# Patient Record
Sex: Female | Born: 1975 | Race: White | Hispanic: No | Marital: Married | Smoking: Never smoker
Health system: Southern US, Community
[De-identification: ages and names within clinical notes are randomized; demographics above are authoritative.]

## PROBLEM LIST (undated history)

## (undated) DIAGNOSIS — Q211 Atrial septal defect: Secondary | ICD-10-CM

## (undated) DIAGNOSIS — Q2112 Patent foramen ovale: Secondary | ICD-10-CM

## (undated) DIAGNOSIS — F419 Anxiety disorder, unspecified: Secondary | ICD-10-CM

---

## 1999-12-15 ENCOUNTER — Other Ambulatory Visit: Admission: RE | Admit: 1999-12-15 | Discharge: 1999-12-15 | Payer: Self-pay | Admitting: Family Medicine

## 2000-01-29 ENCOUNTER — Encounter: Payer: Self-pay | Admitting: Family Medicine

## 2000-01-29 ENCOUNTER — Ambulatory Visit (HOSPITAL_COMMUNITY): Admission: RE | Admit: 2000-01-29 | Discharge: 2000-01-29 | Payer: Self-pay | Admitting: Family Medicine

## 2000-04-22 ENCOUNTER — Ambulatory Visit (HOSPITAL_COMMUNITY): Admission: RE | Admit: 2000-04-22 | Discharge: 2000-04-22 | Payer: Self-pay | Admitting: Family Medicine

## 2000-04-22 ENCOUNTER — Encounter: Payer: Self-pay | Admitting: Family Medicine

## 2000-06-17 ENCOUNTER — Inpatient Hospital Stay (HOSPITAL_COMMUNITY): Admission: AD | Admit: 2000-06-17 | Discharge: 2000-06-20 | Payer: Self-pay | Admitting: Family Medicine

## 2000-06-17 ENCOUNTER — Encounter: Payer: Self-pay | Admitting: Family Medicine

## 2000-12-28 ENCOUNTER — Other Ambulatory Visit: Admission: RE | Admit: 2000-12-28 | Discharge: 2000-12-28 | Payer: Self-pay | Admitting: Family Medicine

## 2002-01-08 ENCOUNTER — Other Ambulatory Visit: Admission: RE | Admit: 2002-01-08 | Discharge: 2002-01-08 | Payer: Self-pay | Admitting: Family Medicine

## 2002-07-02 ENCOUNTER — Other Ambulatory Visit: Admission: RE | Admit: 2002-07-02 | Discharge: 2002-07-02 | Payer: Self-pay | Admitting: Family Medicine

## 2003-08-01 ENCOUNTER — Other Ambulatory Visit: Admission: RE | Admit: 2003-08-01 | Discharge: 2003-08-01 | Payer: Self-pay | Admitting: Family Medicine

## 2004-05-16 ENCOUNTER — Ambulatory Visit (HOSPITAL_COMMUNITY): Admission: RE | Admit: 2004-05-16 | Discharge: 2004-05-16 | Payer: Self-pay | Admitting: Family Medicine

## 2004-09-10 ENCOUNTER — Other Ambulatory Visit: Admission: RE | Admit: 2004-09-10 | Discharge: 2004-09-10 | Payer: Self-pay | Admitting: Family Medicine

## 2005-09-24 ENCOUNTER — Other Ambulatory Visit: Admission: RE | Admit: 2005-09-24 | Discharge: 2005-09-24 | Payer: Self-pay | Admitting: Family Medicine

## 2012-03-24 ENCOUNTER — Other Ambulatory Visit: Payer: Self-pay | Admitting: Family

## 2012-03-24 DIAGNOSIS — Z124 Encounter for screening for malignant neoplasm of cervix: Secondary | ICD-10-CM | POA: Insufficient documentation

## 2012-03-27 ENCOUNTER — Other Ambulatory Visit (HOSPITAL_COMMUNITY)
Admission: RE | Admit: 2012-03-27 | Discharge: 2012-03-27 | Disposition: A | Discharge status: Home / Self Care | Payer: BC Managed Care – PPO | Source: Ambulatory Visit | Attending: Family Medicine | Admitting: Family Medicine

## 2015-11-03 DIAGNOSIS — F418 Other specified anxiety disorders: Secondary | ICD-10-CM | POA: Diagnosis not present

## 2015-11-12 DIAGNOSIS — E663 Overweight: Secondary | ICD-10-CM | POA: Diagnosis not present

## 2015-11-26 DIAGNOSIS — G43909 Migraine, unspecified, not intractable, without status migrainosus: Secondary | ICD-10-CM | POA: Diagnosis not present

## 2015-11-26 DIAGNOSIS — Z713 Dietary counseling and surveillance: Secondary | ICD-10-CM | POA: Diagnosis not present

## 2016-04-28 DIAGNOSIS — K649 Unspecified hemorrhoids: Secondary | ICD-10-CM | POA: Diagnosis not present

## 2016-04-28 DIAGNOSIS — Z23 Encounter for immunization: Secondary | ICD-10-CM | POA: Diagnosis not present

## 2016-04-28 DIAGNOSIS — M79671 Pain in right foot: Secondary | ICD-10-CM | POA: Diagnosis not present

## 2016-05-12 DIAGNOSIS — D229 Melanocytic nevi, unspecified: Secondary | ICD-10-CM | POA: Diagnosis not present

## 2016-05-12 DIAGNOSIS — L814 Other melanin hyperpigmentation: Secondary | ICD-10-CM | POA: Diagnosis not present

## 2016-05-12 DIAGNOSIS — D1801 Hemangioma of skin and subcutaneous tissue: Secondary | ICD-10-CM | POA: Diagnosis not present

## 2016-05-26 DIAGNOSIS — Z0000 Encounter for general adult medical examination without abnormal findings: Secondary | ICD-10-CM | POA: Diagnosis not present

## 2016-05-26 DIAGNOSIS — Z111 Encounter for screening for respiratory tuberculosis: Secondary | ICD-10-CM | POA: Diagnosis not present

## 2016-05-26 DIAGNOSIS — Z13220 Encounter for screening for lipoid disorders: Secondary | ICD-10-CM | POA: Diagnosis not present

## 2016-05-26 DIAGNOSIS — Z131 Encounter for screening for diabetes mellitus: Secondary | ICD-10-CM | POA: Diagnosis not present

## 2016-05-26 DIAGNOSIS — F418 Other specified anxiety disorders: Secondary | ICD-10-CM | POA: Diagnosis not present

## 2016-08-11 DIAGNOSIS — D485 Neoplasm of uncertain behavior of skin: Secondary | ICD-10-CM | POA: Diagnosis not present

## 2016-08-11 DIAGNOSIS — D2261 Melanocytic nevi of right upper limb, including shoulder: Secondary | ICD-10-CM | POA: Diagnosis not present

## 2016-10-04 DIAGNOSIS — K644 Residual hemorrhoidal skin tags: Secondary | ICD-10-CM | POA: Diagnosis not present

## 2016-10-04 DIAGNOSIS — K641 Second degree hemorrhoids: Secondary | ICD-10-CM | POA: Diagnosis not present

## 2016-11-15 DIAGNOSIS — F418 Other specified anxiety disorders: Secondary | ICD-10-CM | POA: Diagnosis not present

## 2016-11-24 DIAGNOSIS — I788 Other diseases of capillaries: Secondary | ICD-10-CM | POA: Diagnosis not present

## 2017-01-19 DIAGNOSIS — F419 Anxiety disorder, unspecified: Secondary | ICD-10-CM | POA: Diagnosis not present

## 2017-01-19 DIAGNOSIS — F418 Other specified anxiety disorders: Secondary | ICD-10-CM | POA: Diagnosis not present

## 2017-03-30 DIAGNOSIS — F418 Other specified anxiety disorders: Secondary | ICD-10-CM | POA: Diagnosis not present

## 2017-03-30 DIAGNOSIS — Z23 Encounter for immunization: Secondary | ICD-10-CM | POA: Diagnosis not present

## 2017-10-13 DIAGNOSIS — F418 Other specified anxiety disorders: Secondary | ICD-10-CM | POA: Diagnosis not present

## 2017-10-13 DIAGNOSIS — Z3041 Encounter for surveillance of contraceptive pills: Secondary | ICD-10-CM | POA: Diagnosis not present

## 2017-10-13 DIAGNOSIS — F419 Anxiety disorder, unspecified: Secondary | ICD-10-CM | POA: Diagnosis not present

## 2017-12-27 DIAGNOSIS — M5442 Lumbago with sciatica, left side: Secondary | ICD-10-CM | POA: Diagnosis not present

## 2018-04-12 DIAGNOSIS — F418 Other specified anxiety disorders: Secondary | ICD-10-CM | POA: Diagnosis not present

## 2018-04-12 DIAGNOSIS — R350 Frequency of micturition: Secondary | ICD-10-CM | POA: Diagnosis not present

## 2018-05-22 DIAGNOSIS — Z23 Encounter for immunization: Secondary | ICD-10-CM | POA: Diagnosis not present

## 2018-05-22 DIAGNOSIS — R300 Dysuria: Secondary | ICD-10-CM | POA: Diagnosis not present

## 2018-07-07 DIAGNOSIS — L03213 Periorbital cellulitis: Secondary | ICD-10-CM | POA: Diagnosis not present

## 2018-10-12 DIAGNOSIS — G479 Sleep disorder, unspecified: Secondary | ICD-10-CM | POA: Diagnosis not present

## 2018-10-12 DIAGNOSIS — F39 Unspecified mood [affective] disorder: Secondary | ICD-10-CM | POA: Diagnosis not present

## 2018-10-12 DIAGNOSIS — F419 Anxiety disorder, unspecified: Secondary | ICD-10-CM | POA: Diagnosis not present

## 2019-05-02 DIAGNOSIS — Z124 Encounter for screening for malignant neoplasm of cervix: Secondary | ICD-10-CM | POA: Diagnosis not present

## 2019-05-02 DIAGNOSIS — Z0000 Encounter for general adult medical examination without abnormal findings: Secondary | ICD-10-CM | POA: Diagnosis not present

## 2019-05-02 DIAGNOSIS — Z13220 Encounter for screening for lipoid disorders: Secondary | ICD-10-CM | POA: Diagnosis not present

## 2019-05-02 DIAGNOSIS — Z23 Encounter for immunization: Secondary | ICD-10-CM | POA: Diagnosis not present

## 2019-05-07 DIAGNOSIS — G5601 Carpal tunnel syndrome, right upper limb: Secondary | ICD-10-CM | POA: Diagnosis not present

## 2019-10-01 ENCOUNTER — Encounter (HOSPITAL_BASED_OUTPATIENT_CLINIC_OR_DEPARTMENT_OTHER): Payer: Self-pay | Admitting: Orthopaedic Surgery

## 2019-10-01 ENCOUNTER — Other Ambulatory Visit: Payer: Self-pay

## 2019-10-04 ENCOUNTER — Other Ambulatory Visit (HOSPITAL_COMMUNITY)
Admission: RE | Admit: 2019-10-04 | Discharge: 2019-10-04 | Disposition: A | Discharge status: Home / Self Care | Payer: 59 | Source: Ambulatory Visit | Attending: Orthopaedic Surgery | Admitting: Orthopaedic Surgery

## 2019-10-04 DIAGNOSIS — Z20822 Contact with and (suspected) exposure to covid-19: Secondary | ICD-10-CM | POA: Diagnosis not present

## 2019-10-04 DIAGNOSIS — Z01812 Encounter for preprocedural laboratory examination: Secondary | ICD-10-CM | POA: Insufficient documentation

## 2019-10-04 LAB — SARS CORONAVIRUS 2 (TAT 6-24 HRS): SARS Coronavirus 2: NEGATIVE

## 2019-10-04 NOTE — Progress Notes (Signed)

## 2019-10-08 ENCOUNTER — Ambulatory Visit (HOSPITAL_BASED_OUTPATIENT_CLINIC_OR_DEPARTMENT_OTHER): Payer: Worker's Compensation | Admitting: Anesthesiology

## 2019-10-08 ENCOUNTER — Encounter (HOSPITAL_BASED_OUTPATIENT_CLINIC_OR_DEPARTMENT_OTHER)
Admission: RE | Disposition: A | Discharge status: Home / Self Care | Payer: Self-pay | Source: Home / Self Care | Attending: Orthopaedic Surgery

## 2019-10-08 ENCOUNTER — Encounter (HOSPITAL_BASED_OUTPATIENT_CLINIC_OR_DEPARTMENT_OTHER): Payer: Self-pay | Admitting: Orthopaedic Surgery

## 2019-10-08 ENCOUNTER — Ambulatory Visit (HOSPITAL_BASED_OUTPATIENT_CLINIC_OR_DEPARTMENT_OTHER)
Admission: RE | Admit: 2019-10-08 | Discharge: 2019-10-08 | Disposition: A | Discharge status: Home / Self Care | Payer: Worker's Compensation | Attending: Orthopaedic Surgery | Admitting: Orthopaedic Surgery

## 2019-10-08 DIAGNOSIS — F419 Anxiety disorder, unspecified: Secondary | ICD-10-CM | POA: Insufficient documentation

## 2019-10-08 DIAGNOSIS — Z79899 Other long term (current) drug therapy: Secondary | ICD-10-CM | POA: Diagnosis not present

## 2019-10-08 DIAGNOSIS — Q211 Atrial septal defect: Secondary | ICD-10-CM | POA: Insufficient documentation

## 2019-10-08 DIAGNOSIS — G5621 Lesion of ulnar nerve, right upper limb: Secondary | ICD-10-CM | POA: Diagnosis not present

## 2019-10-08 DIAGNOSIS — G5601 Carpal tunnel syndrome, right upper limb: Secondary | ICD-10-CM | POA: Diagnosis present

## 2019-10-08 HISTORY — PX: CARPAL TUNNEL RELEASE: SHX101

## 2019-10-08 HISTORY — DX: Atrial septal defect: Q21.1

## 2019-10-08 HISTORY — DX: Patent foramen ovale: Q21.12

## 2019-10-08 HISTORY — DX: Anxiety disorder, unspecified: F41.9

## 2019-10-08 LAB — POCT PREGNANCY, URINE: Preg Test, Ur: NEGATIVE

## 2019-10-08 SURGERY — CARPAL TUNNEL RELEASE
Anesthesia: Monitor Anesthesia Care | Site: Arm Lower | Laterality: Right

## 2019-10-08 MED ORDER — DEXAMETHASONE SODIUM PHOSPHATE 10 MG/ML IJ SOLN
INTRAMUSCULAR | Status: DC | PRN
  Administered 2019-10-08: 10 mg

## 2019-10-08 MED ORDER — ROPIVACAINE HCL 5 MG/ML IJ SOLN
INTRAMUSCULAR | Status: DC | PRN
  Administered 2019-10-08: 30 mL via PERINEURAL

## 2019-10-08 MED ORDER — BUPIVACAINE HCL (PF) 0.5 % IJ SOLN
INTRAMUSCULAR | Status: AC
  Filled 2019-10-08: qty 30

## 2019-10-08 MED ORDER — CEFAZOLIN SODIUM-DEXTROSE 2-4 GM/100ML-% IV SOLN
2.0000 g | INTRAVENOUS | Status: AC
  Administered 2019-10-08: 2 g via INTRAVENOUS

## 2019-10-08 MED ORDER — SCOPOLAMINE 1 MG/3DAYS TD PT72
MEDICATED_PATCH | TRANSDERMAL | Status: AC
  Filled 2019-10-08: qty 1

## 2019-10-08 MED ORDER — CHLORHEXIDINE GLUCONATE 4 % EX LIQD: 60.0000 mL | Freq: Once | CUTANEOUS | Status: DC

## 2019-10-08 MED ORDER — 0.9 % SODIUM CHLORIDE (POUR BTL) OPTIME
TOPICAL | Status: DC | PRN
  Administered 2019-10-08: 11:00:00 1000 mL

## 2019-10-08 MED ORDER — SCOPOLAMINE 1 MG/3DAYS TD PT72
1.0000 | MEDICATED_PATCH | TRANSDERMAL | Status: DC
  Administered 2019-10-08: 1.5 mg via TRANSDERMAL

## 2019-10-08 MED ORDER — HYDROCODONE-ACETAMINOPHEN 5-325 MG PO TABS: 1.0000 | ORAL_TABLET | Freq: Four times a day (QID) | ORAL | 0 refills | Status: AC | PRN

## 2019-10-08 MED ORDER — PROPOFOL 10 MG/ML IV BOLUS
INTRAVENOUS | Status: DC | PRN
  Administered 2019-10-08: 20 mg via INTRAVENOUS

## 2019-10-08 MED ORDER — FENTANYL CITRATE (PF) 100 MCG/2ML IJ SOLN
50.0000 ug | INTRAMUSCULAR | Status: DC | PRN
  Administered 2019-10-08: 50 ug via INTRAVENOUS

## 2019-10-08 MED ORDER — LIDOCAINE HCL (PF) 1 % IJ SOLN
INTRAMUSCULAR | Status: AC
  Filled 2019-10-08: qty 30

## 2019-10-08 MED ORDER — ONDANSETRON HCL 4 MG/2ML IJ SOLN
INTRAMUSCULAR | Status: DC | PRN
  Administered 2019-10-08: 4 mg via INTRAVENOUS

## 2019-10-08 MED ORDER — MIDAZOLAM HCL 2 MG/2ML IJ SOLN
1.0000 mg | INTRAMUSCULAR | Status: DC | PRN
  Administered 2019-10-08: 2 mg via INTRAVENOUS

## 2019-10-08 MED ORDER — ACETAMINOPHEN 500 MG PO TABS
1000.0000 mg | ORAL_TABLET | Freq: Once | ORAL | Status: AC
  Administered 2019-10-08: 1000 mg via ORAL

## 2019-10-08 MED ORDER — PROPOFOL 500 MG/50ML IV EMUL
INTRAVENOUS | Status: DC | PRN
  Administered 2019-10-08: 200 ug/kg/min via INTRAVENOUS

## 2019-10-08 MED ORDER — FENTANYL CITRATE (PF) 100 MCG/2ML IJ SOLN
INTRAMUSCULAR | Status: AC
  Filled 2019-10-08: qty 2

## 2019-10-08 MED ORDER — LACTATED RINGERS IV SOLN: INTRAVENOUS | Status: DC

## 2019-10-08 MED ORDER — ACETAMINOPHEN 500 MG PO TABS
ORAL_TABLET | ORAL | Status: AC
  Filled 2019-10-08: qty 2

## 2019-10-08 MED ORDER — CEFAZOLIN SODIUM-DEXTROSE 2-4 GM/100ML-% IV SOLN
INTRAVENOUS | Status: AC
  Filled 2019-10-08: qty 100

## 2019-10-08 MED ORDER — MIDAZOLAM HCL 2 MG/2ML IJ SOLN
INTRAMUSCULAR | Status: AC
  Filled 2019-10-08: qty 2

## 2019-10-08 SURGICAL SUPPLY — 50 items
APL PRP STRL LF DISP 70% ISPRP (MISCELLANEOUS) ×1
BLADE SURG 15 STRL LF DISP TIS (BLADE) ×2 IMPLANT
BLADE SURG 15 STRL SS (BLADE) ×4
BNDG CMPR 9X4 STRL LF SNTH (GAUZE/BANDAGES/DRESSINGS) ×1
BNDG COHESIVE 4X5 TAN STRL (GAUZE/BANDAGES/DRESSINGS) ×1 IMPLANT
BNDG ESMARK 4X9 LF (GAUZE/BANDAGES/DRESSINGS) ×2 IMPLANT
BNDG GAUZE ELAST 4 BULKY (GAUZE/BANDAGES/DRESSINGS) ×2 IMPLANT
CHLORAPREP W/TINT 26 (MISCELLANEOUS) ×2 IMPLANT
CORD BIPOLAR FORCEPS 12FT (ELECTRODE) ×2 IMPLANT
COVER BACK TABLE 60X90IN (DRAPES) ×2 IMPLANT
COVER WAND RF STERILE (DRAPES) IMPLANT
CUFF TOURN SGL QUICK 18X4 (TOURNIQUET CUFF) IMPLANT
DRAIN PENROSE 1/4X12 LTX STRL (WOUND CARE) IMPLANT
DRAPE EXTREMITY T 121X128X90 (DISPOSABLE) ×2 IMPLANT
DRAPE SURG 17X23 STRL (DRAPES) ×2 IMPLANT
DRSG EMULSION OIL 3X3 NADH (GAUZE/BANDAGES/DRESSINGS) ×2 IMPLANT
GAUZE 4X4 16PLY RFD (DISPOSABLE) IMPLANT
GAUZE SPONGE 4X4 12PLY STRL (GAUZE/BANDAGES/DRESSINGS) ×1 IMPLANT
GAUZE XEROFORM 1X8 LF (GAUZE/BANDAGES/DRESSINGS) ×2 IMPLANT
GLOVE BIOGEL PI IND STRL 8 (GLOVE) ×1 IMPLANT
GLOVE BIOGEL PI INDICATOR 8 (GLOVE) ×1
GLOVE SURG SYN 7.5  E (GLOVE) ×2
GLOVE SURG SYN 7.5 E (GLOVE) ×1 IMPLANT
GLOVE SURG SYN 7.5 PF PI (GLOVE) ×1 IMPLANT
GOWN STRL REUS W/ TWL LRG LVL3 (GOWN DISPOSABLE) ×2 IMPLANT
GOWN STRL REUS W/TWL LRG LVL3 (GOWN DISPOSABLE) ×4
LOOP VESSEL MAXI BLUE (MISCELLANEOUS) IMPLANT
NDL HYPO 25X1 1.5 SAFETY (NEEDLE) ×2 IMPLANT
NDL SAFETY ECLIPSE 18X1.5 (NEEDLE) ×1 IMPLANT
NEEDLE HYPO 18GX1.5 SHARP (NEEDLE) ×2
NEEDLE HYPO 22GX1.5 SAFETY (NEEDLE) IMPLANT
NEEDLE HYPO 25X1 1.5 SAFETY (NEEDLE) ×4 IMPLANT
NS IRRIG 1000ML POUR BTL (IV SOLUTION) ×2 IMPLANT
PACK BASIN DAY SURGERY FS (CUSTOM PROCEDURE TRAY) ×2 IMPLANT
PAD ALCOHOL SWAB (MISCELLANEOUS) ×16 IMPLANT
PAD CAST 3X4 CTTN HI CHSV (CAST SUPPLIES) ×2 IMPLANT
PAD CAST 4YDX4 CTTN HI CHSV (CAST SUPPLIES) IMPLANT
PADDING CAST ABS 4INX4YD NS (CAST SUPPLIES) ×1
PADDING CAST ABS COTTON 4X4 ST (CAST SUPPLIES) ×1 IMPLANT
PADDING CAST COTTON 3X4 STRL (CAST SUPPLIES) ×4
PADDING CAST COTTON 4X4 STRL (CAST SUPPLIES) ×2
SHEET MEDIUM DRAPE 40X70 STRL (DRAPES) ×2 IMPLANT
SLING ARM FOAM STRAP MED (SOFTGOODS) ×1 IMPLANT
SUT PROLENE 4 0 PS 2 18 (SUTURE) ×2 IMPLANT
SUT VIC AB 3-0 FS2 27 (SUTURE) ×1 IMPLANT
SYR BULB 3OZ (MISCELLANEOUS) ×2 IMPLANT
SYR CONTROL 10ML LL (SYRINGE) ×4 IMPLANT
TOWEL GREEN STERILE FF (TOWEL DISPOSABLE) ×2 IMPLANT
TRAY DSU PREP LF (CUSTOM PROCEDURE TRAY) ×2 IMPLANT
UNDERPAD 30X36 HEAVY ABSORB (UNDERPADS AND DIAPERS) ×2 IMPLANT

## 2019-10-08 NOTE — Anesthesia Preprocedure Evaluation (Signed)
Anesthesia Evaluation  Patient identified by MRN, date of birth, ID band Patient awake    Reviewed: Allergy & Precautions, NPO status , Patient's Chart, lab work & pertinent test results  Airway Mallampati: II  TM Distance: >3 FB Neck ROM: Full    Dental no notable dental hx. (+) Teeth Intact, Dental Advisory Given   Pulmonary neg pulmonary ROS,    Pulmonary exam normal breath sounds clear to auscultation       Cardiovascular Normal cardiovascular exam Rhythm:Regular Rate:Normal  PFO   Neuro/Psych PSYCHIATRIC DISORDERS Anxiety negative neurological ROS     GI/Hepatic negative GI ROS, Neg liver ROS,   Endo/Other  negative endocrine ROS  Renal/GU negative Renal ROS  negative genitourinary   Musculoskeletal negative musculoskeletal ROS (+)   Abdominal Normal abdominal exam  (+)   Peds negative pediatric ROS (+)  Hematology negative hematology ROS (+)   Anesthesia Other Findings Right carpal tunnel, right cubital tunnel syndrome   Reproductive/Obstetrics negative OB ROS                             Anesthesia Physical Anesthesia Plan  ASA: II  Anesthesia Plan: MAC and Regional   Post-op Pain Management:  Regional for Post-op pain   Induction:   PONV Risk Score and Plan: 2 and Propofol infusion and TIVA  Airway Management Planned: Natural Airway and Simple Face Mask  Additional Equipment: None  Intra-op Plan:   Post-operative Plan:   Informed Consent: I have reviewed the patients History and Physical, chart, labs and discussed the procedure including the risks, benefits and alternatives for the proposed anesthesia with the patient or authorized representative who has indicated his/her understanding and acceptance.       Plan Discussed with: CRNA  Anesthesia Plan Comments:         Anesthesia Quick Evaluation

## 2019-10-08 NOTE — Op Note (Signed)
PREOPERATIVE DIAGNOSIS: 1. Right carpal tunnel syndrome 2.  Right cubital tunnel syndrome  POSTOPERATIVE DIAGNOSIS: Same  ATTENDING PHYSICIAN: Maudry Mayhew. Jeannie Fend, III, MD who was present and scrubbed for the entire case   ASSISTANT SURGEON: None.   ANESTHESIA: MAC with regional  SURGICAL PROCEDURES: 1.  Right carpal tunnel release 2.  Right cubital tunnel release, in situ  SURGICAL INDICATIONS: Patient is a 44 year old female who is been seen and evaluated by me in clinic.  She had nearly 2 years of numbness and tingling into the right hand which was initially treated at outside facility.  She underwent long history of brace immobilization primarily at night, activity modifications, oral anti-inflammatories as well as multiple carpal tunnel steroid injections.  She had some temporary improvement with all of these nonoperative management but she went on to continue to have numbness and tingling into her right hand.  Additionally at her most recent clinic appointment, she had intermittent numbness and tingling into the ulnar nerve distributions as well as an exam which was consistent with cubital tunnel syndrome as well.  She had a EMG which confirmed moderate right-sided carpal tunnel syndrome.  We had a long discussion together regarding both operative and nonoperative treatment measures.  Nonoperative management would include continued brace immobilization, possible repeat injections, occupational therapy and continued oral NSAID use.  At this point she had felt that she had exhausted all conservative measures and did wish to proceed forward with right carpal and cubital tunnel release and presents today for that.  FINDING: There is compression of the median nerve in the carpal tunnel by thickened transverse carpal ligament.  Successful carpal tunnel release was performed with intact median nerve and deep tendinous structures.  Additionally the ulnar nerve was compressed by thickened leading edge  of the FCU fascia.  Successful cubital tunnel release was performed and the nerve is stable posterior to the medial epicondyle.  DESCRIPTION OF PROCEDURE: The patient was identified in the preoperative holding area where the risk benefits and alternatives of the procedure were once again discussed the patient.  These include but are not limited to infection, bleeding, damage to surrounding structures including blood vessels and nerves, pain, stiffness, recurrence and need for additional procedures.  Informed consent was obtained at that time the patient's right arm was marked with a surgical marking pen.  She then underwent a right upper extremity plexus block by anesthesia.  She was brought to the operative suite where timeout was performed identifying the correct patient operative site.  She was positioned supine on the operative table with her hand outstretched on a hand table.  She was induced under MAC sedation.  A tourniquet was placed in the upper arm and the arm was then prepped and draped in usual sterile fashion.  The limb was exsanguinated and the tourniquet was inflated.  A longitudinal incision was made in the palm in line with the fourth ray.  This was centered around the distal aspect of the transverse carpal ligament in the area of Kaplan's cardinal line.  Sharp dissection was carried down through the subcutaneous tissues.  The pulmonary upon neurosis was identified and split in line with the surgical incision.  The transverse fibers of the transverse carpal ligament were then seen and carefully incised utilizing a 15 blade.  This was done working distal to proximal.  Care was taken to ensure ensure full complete distal release of the ligament with visualization of the palmar fat.  Care was taken to protect the deep  underlying median nerve.  The ligament was then released up to the proximal portion of the skin incision.  At this point a hemostat was passed palmar to the transverse carpal  ligament, bluntly dissecting throughout this area.  The KM I, carpal tunnel release guide was then inserted deep to the transverse carpal ligament.  It was compressed along the undersurface ligament as it was carefully advanced within the carpal tunnel.  Once deemed to be in appropriate position, the blade was used and carefully advanced through the groove and the guide releasing the remaining, proximal portion of the ligament.  At this point the wound was explored and the nerve was found to be fully intact and the transverse carpal ligament was fully released.  The wound was copiously irrigated with normal saline and skin was closed with interrupted 4-0 Prolene sutures.  Attention was then turned to the medial elbow.  A a curved incision was made along the medial aspect of the elbow centered around the medial epicondyle.  Blunt dissection was carried down through subcutaneous tissues.  Care was taken to protect crossing superficial nerves.  The ulnar nerve was then visualized as it coursed along the posterior aspect of the medial epicondyle.  Blunt dissection was performed around the nerve and the nerve was identified proximal to the medial condyle.  Working retrograde, sharp dissection along the nerve was performed releasing it from its overlying soft tissue constraints up to the level of the arcade of Struthers.  The nerve was released from the intermuscular septum along the medial arm.  I then traced the nerve and released it from its soft tissue constraints as it coursed posterior to the medial epicondyle.  There is thickened Osborne's ligament as well as a thickened leading edge of the FCU fascia.  Both these areas were released.  The FCU fascia was then incised in line with the course of the nerve and the 2 heads of the FCU muscle were bluntly dissected.  The deep fascial band was then also released down to the level of the first motor branch to the FCU.  This provided full release of the nerve throughout  its course along the medial elbow.  The elbow was then flexed extended through a full arc range of motion of the remain stable, posterior to the medial epicondyle.  At this point the wound was then copiously irrigated with normal saline.  The skin was closed with deep 3-0 Vicryl sutures followed by interrupted 4-0 Prolene sutures.  Xeroform and 4 x 4's were placed on both incisions.  A well-padded long-arm splint was then placed.  The tourniquet was released and the patient had return of brisk capillary refill to all of her digits.  She tolerated the procedure well and there were no complications.  She was taken the PACU in stable condition.  ESTIMATED BLOOD LOSS: Less than 10 mL  TOURNIQUET TIME: 26 minutes  SPECIMENS: None  POSTOPERATIVE PLAN: The patient will be discharged home and seen back  in the office in approximately 10-12 days for wound check, suture  removal, and then be sent to a therapist for early range of motion of the hand, wrist and elbow, Edema control and wound care.  She can begin light strengthening exercises at approximately 6 weeks postoperatively.  IMPLANTS: None

## 2019-10-08 NOTE — Transfer of Care (Signed)
Immediate Anesthesia Transfer of Care Note  Patient: Brenda Baldwin  Procedure(s) Performed: Right carpal tunnel release, right cubital tunnel release (Right Arm Lower)  Patient Location: PACU  Anesthesia Type:MAC combined with regional for post-op pain  Level of Consciousness: awake, alert  and oriented  Airway & Oxygen Therapy: Patient Spontanous Breathing  Post-op Assessment: Report given to RN and Post -op Vital signs reviewed and stable  Post vital signs: Reviewed and stable  Last Vitals:  Vitals Value Taken Time  BP 117/69 10/08/19 1148  Temp    Pulse 75 10/08/19 1150  Resp 18 10/08/19 1150  SpO2 100 % 10/08/19 1150  Vitals shown include unvalidated device data.  Last Pain:  Vitals:   10/08/19 0939  TempSrc: Temporal  PainSc: 0-No pain         Complications: No apparent anesthesia complications

## 2019-10-08 NOTE — H&P (Signed)
ORTHOPAEDIC H&P  PCP:  Judge Stall, FNP (Inactive)  Chief Complaint: Right hand numbness and tingling  HPI: Brenda Baldwin is a 44 y.o. female who complains of right hand numbness and tingling.  She was seen in my clinic where she was found to have right carpal and cubital tunnel syndrome.  This had been present for several months.  She underwent initial nonoperative management with carpal tunnel injection as well as a brace immobilization.  She noted some mild, initial improvement in her symptoms but had progressive, numbness and tingling to the right upper extremity which limited her ability to use the hand on a normal basis.  We had a long discussion in clinic together regarding both nonoperative and operative treatment options and the patient did wish to proceed forward with right carpal and cubital tunnel release and presents today for that.  Past Medical History:  Diagnosis Date  . Anxiety   . PFO (patent foramen ovale)    Past Surgical History:  Procedure Laterality Date  . CESAREAN SECTION     x2   Social History   Socioeconomic History  . Marital status: Married    Spouse name: Not on file  . Number of children: Not on file  . Years of education: Not on file  . Highest education level: Not on file  Occupational History  . Not on file  Tobacco Use  . Smoking status: Never Smoker  . Smokeless tobacco: Never Used  Substance and Sexual Activity  . Alcohol use: Yes    Comment: rare  . Drug use: Never  . Sexual activity: Not on file  Other Topics Concern  . Not on file  Social History Narrative  . Not on file   Social Determinants of Health   Financial Resource Strain:   . Difficulty of Paying Living Expenses:   Food Insecurity:   . Worried About Charity fundraiser in the Last Year:   . Arboriculturist in the Last Year:   Transportation Needs:   . Film/video editor (Medical):   Marland Kitchen Lack of Transportation (Non-Medical):   Physical Activity:   . Days  of Exercise per Week:   . Minutes of Exercise per Session:   Stress:   . Feeling of Stress :   Social Connections:   . Frequency of Communication with Friends and Family:   . Frequency of Social Gatherings with Friends and Family:   . Attends Religious Services:   . Active Member of Clubs or Organizations:   . Attends Archivist Meetings:   Marland Kitchen Marital Status:    History reviewed. No pertinent family history. No Known Allergies Prior to Admission medications   Medication Sig Start Date End Date Taking? Authorizing Provider  escitalopram (LEXAPRO) 10 MG tablet Take 10 mg by mouth daily.   Yes [provider]  norgestimate-ethinyl estradiol ([place] 28) 0.25-35 MG-MCG tablet Take 1 tablet by mouth daily.   Yes [provider]   No results found.  Positive ROS: All other systems have been reviewed and were otherwise negative with the exception of those mentioned in the HPI and as above.  Physical Exam: General: Alert, no acute distress Cardiovascular: No pedal edema Respiratory: No cyanosis, no use of accessory musculature Skin: No lesions in the area of chief complaint Psychiatric: Patient is competent for consent with normal mood and affect  Neurologic: Normal sensation to the right ulnar nerve distribution, radial nerve distribution, and median nerve distribution. Positive  Tinel's and Phalen's/Durkan's tests. Positive Tinel's of the ulnar nerve. Positive elbow flexion test.  Wrists: Examination of the right upper extremity shows a grossly normal appearing right wrist and hand. There is no swelling, erythema or signs of infection. There is no tenderness to palpation to the radial styloid, anatomic snuffbox, DRUJ, the scapholunate interval, or distal radius. No tenderness at the radiocarpal joint. Full digit and wrist range of motion  Assessment: #1 right carpal and cubital tunnel syndrome  Plan: OR today for right carpal and cubital tunnel release with  possible anterior transposition.  Risks of surgery were once again discussed with the patient today.  These include but not limited to infection, bleeding, damage to surrounding structures including blood vessels and nerves, pain, stiffness, recurrence and need for additional procedures.  Informed consent was obtained the patient's right arm was marked.  Plan for discharge home postoperatively with follow-up with me in approximately 10 to 14 days.    Ernest Mallick, MD [phone]   10/08/2019 10:42 AM

## 2019-10-08 NOTE — Progress Notes (Signed)
Assisted Dr. Finucane with right, ultrasound guided, supraclavicular block. Side rails up, monitors on throughout procedure. See vital signs in flow sheet. Tolerated Procedure well. 

## 2019-10-08 NOTE — Discharge Instructions (Signed)
No Tylenol until 4:00pm if needed.  Discharge Instructions  - Keep dressings in place. Do not remove them. - The dressings must stay dry - Take all medication as prescribed. Transition to over the counter pain medication as your pain improves - Keep the hand elevated over the next 48-72 hours to help with pain and swelling - Move all digits not restricted by the dressings regularly to prevent stiffness - Please call to schedule a follow up appointment with Dr. Roney Mans and therapy at [phone] for 10-14 days following surgery - Your pain medication have been send digitally to your pharmacy    Post Anesthesia Home Care Instructions  Activity: Get plenty of rest for the remainder of the day. A responsible individual must stay with you for 24 hours following the procedure.  For the next 24 hours, DO NOT: -Drive a car -Advertising copywriter -Drink alcoholic beverages -Take any medication unless instructed by your physician -Make any legal decisions or sign important papers.  Meals: Start with liquid foods such as gelatin or soup. Progress to regular foods as tolerated. Avoid greasy, spicy, heavy foods. If nausea and/or vomiting occur, drink only clear liquids until the nausea and/or vomiting subsides. Call your physician if vomiting continues.  Special Instructions/Symptoms: Your throat may feel dry or sore from the anesthesia or the breathing tube placed in your throat during surgery. If this causes discomfort, gargle with warm salt water. The discomfort should disappear within 24 hours.  If you had a scopolamine patch placed behind your ear for the management of post- operative nausea and/or vomiting:  1. The medication in the patch is effective for 72 hours, after which it should be removed.  Wrap patch in a tissue and discard in the trash. Wash hands thoroughly with soap and water. 2. You may remove the patch earlier than 72 hours if you experience unpleasant side effects which  may include dry mouth, dizziness or visual disturbances. 3. Avoid touching the patch. Wash your hands with soap and water after contact with the patch.    Regional Anesthesia Blocks  1. Numbness or the inability to move the "blocked" extremity may last from 3-48 hours after placement. The length of time depends on the medication injected and your individual response to the medication. If the numbness is not going away after 48 hours, call your surgeon.  2. The extremity that is blocked will need to be protected until the numbness is gone and the  Strength has returned. Because you cannot feel it, you will need to take extra care to avoid injury. Because it may be weak, you may have difficulty moving it or using it. You may not know what position it is in without looking at it while the block is in effect.  3. For blocks in the legs and feet, returning to weight bearing and walking needs to be done carefully. You will need to wait until the numbness is entirely gone and the strength has returned. You should be able to move your leg and foot normally before you try and bear weight or walk. You will need someone to be with you when you first try to ensure you do not fall and possibly risk injury.  4. Bruising and tenderness at the needle site are common side effects and will resolve in a few days.  5. Persistent numbness or new problems with movement should be communicated to the surgeon or the [facility] [phone] [facility] [phone]).

## 2019-10-08 NOTE — Anesthesia Procedure Notes (Signed)
Anesthesia Regional Block: Supraclavicular block   Pre-Anesthetic Checklist: ,, timeout performed, Correct Patient, Correct Site, Correct Laterality, Correct Procedure, Correct Position, site marked, Risks and benefits discussed,  Surgical consent,  Pre-op evaluation,  At surgeon's request and post-op pain management  Laterality: Right  Prep: Maximum Sterile Barrier Precautions used, chloraprep       Needles:  Injection technique: Single-shot  Needle Type: Echogenic Stimulator Needle     Needle Length: 9cm  Needle Gauge: 22     Additional Needles:   Procedures:,,,, ultrasound used (permanent image in chart),,,,  Narrative:  Start time: 10/08/2019 9:15 AM End time: 10/08/2019 9:25 AM Injection made incrementally with aspirations every 5 mL.  Performed by: Personally  Anesthesiologist: Lannie Fields, DO  Additional Notes: Monitors applied. No increased pain on injection. No increased resistance to injection. Injection made in 5cc increments. Good needle visualization. Patient tolerated procedure well.

## 2019-10-08 NOTE — Anesthesia Procedure Notes (Signed)
Procedure Name: MAC Date/Time: 10/08/2019 10:59 AM Performed by: Imagene Riches, CRNA Pre-anesthesia Checklist: Patient identified, Emergency Drugs available and Suction available Patient Re-evaluated:Patient Re-evaluated prior to induction Oxygen Delivery Method: Simple face mask

## 2019-10-08 NOTE — Anesthesia Postprocedure Evaluation (Signed)
Anesthesia Post Note  Patient: Brenda Baldwin  Procedure(s) Performed: Right carpal tunnel release, right cubital tunnel release (Right Arm Lower)     Patient location during evaluation: PACU Anesthesia Type: Regional and MAC Level of consciousness: awake and alert Pain management: pain level controlled Vital Signs Assessment: post-procedure vital signs reviewed and stable Respiratory status: spontaneous breathing, nonlabored ventilation and respiratory function stable Cardiovascular status: blood pressure returned to baseline and stable Postop Assessment: no apparent nausea or vomiting Anesthetic complications: no    Last Vitals:  Vitals:   10/08/19 1207 10/08/19 1230  BP: 101/69 112/73  Pulse: 66 72  Resp: 14 16  Temp:  36.6 C  SpO2: 100% 100%    Last Pain:  Vitals:   10/08/19 0939  TempSrc: Temporal  PainSc: 0-No pain                 Pervis Hocking

## 2019-10-09 ENCOUNTER — Encounter: Payer: Self-pay | Admitting: *Deleted

## 2021-07-06 ENCOUNTER — Other Ambulatory Visit: Payer: Self-pay | Admitting: Nurse Practitioner

## 2021-07-06 DIAGNOSIS — Z1231 Encounter for screening mammogram for malignant neoplasm of breast: Secondary | ICD-10-CM

## 2021-07-29 ENCOUNTER — Other Ambulatory Visit: Payer: Self-pay

## 2021-07-29 ENCOUNTER — Ambulatory Visit (INDEPENDENT_AMBULATORY_CARE_PROVIDER_SITE_OTHER): Payer: 59

## 2021-07-29 DIAGNOSIS — Z1231 Encounter for screening mammogram for malignant neoplasm of breast: Secondary | ICD-10-CM | POA: Diagnosis not present

## 2021-07-31 ENCOUNTER — Other Ambulatory Visit: Payer: Self-pay | Admitting: Nurse Practitioner

## 2021-07-31 DIAGNOSIS — R928 Other abnormal and inconclusive findings on diagnostic imaging of breast: Secondary | ICD-10-CM

## 2021-08-05 ENCOUNTER — Ambulatory Visit
Admission: RE | Admit: 2021-08-05 | Discharge: 2021-08-05 | Disposition: A | Discharge status: Home / Self Care | Payer: 59 | Source: Ambulatory Visit | Attending: Nurse Practitioner | Admitting: Nurse Practitioner

## 2021-08-05 ENCOUNTER — Other Ambulatory Visit: Payer: Self-pay | Admitting: Nurse Practitioner

## 2021-08-05 DIAGNOSIS — N6489 Other specified disorders of breast: Secondary | ICD-10-CM

## 2021-08-05 DIAGNOSIS — R928 Other abnormal and inconclusive findings on diagnostic imaging of breast: Secondary | ICD-10-CM

## 2021-08-17 ENCOUNTER — Ambulatory Visit
Admission: RE | Admit: 2021-08-17 | Discharge: 2021-08-17 | Disposition: A | Discharge status: Home / Self Care | Payer: 59 | Source: Ambulatory Visit | Attending: Nurse Practitioner | Admitting: Nurse Practitioner

## 2021-08-17 DIAGNOSIS — N6489 Other specified disorders of breast: Secondary | ICD-10-CM

## 2021-08-26 ENCOUNTER — Other Ambulatory Visit: Payer: 59

## 2021-12-08 ENCOUNTER — Other Ambulatory Visit: Payer: Self-pay | Admitting: Family Medicine

## 2021-12-09 ENCOUNTER — Other Ambulatory Visit: Payer: Self-pay | Admitting: Family Medicine

## 2021-12-09 DIAGNOSIS — R102 Pelvic and perineal pain: Secondary | ICD-10-CM

## 2021-12-16 ENCOUNTER — Other Ambulatory Visit: Payer: Self-pay | Admitting: Family Medicine

## 2021-12-16 DIAGNOSIS — R102 Pelvic and perineal pain: Secondary | ICD-10-CM

## 2021-12-25 ENCOUNTER — Ambulatory Visit (INDEPENDENT_AMBULATORY_CARE_PROVIDER_SITE_OTHER): Payer: 59

## 2021-12-25 DIAGNOSIS — R102 Pelvic and perineal pain: Secondary | ICD-10-CM | POA: Diagnosis not present

## 2022-10-08 LAB — HM MAMMOGRAPHY

## 2023-03-18 DIAGNOSIS — Z0000 Encounter for general adult medical examination without abnormal findings: Secondary | ICD-10-CM | POA: Diagnosis not present

## 2023-06-10 DIAGNOSIS — D1809 Hemangioma of other sites: Secondary | ICD-10-CM | POA: Diagnosis not present

## 2023-06-10 DIAGNOSIS — Z1283 Encounter for screening for malignant neoplasm of skin: Secondary | ICD-10-CM | POA: Diagnosis not present

## 2023-06-10 DIAGNOSIS — D485 Neoplasm of uncertain behavior of skin: Secondary | ICD-10-CM | POA: Diagnosis not present

## 2023-06-10 DIAGNOSIS — L218 Other seborrheic dermatitis: Secondary | ICD-10-CM | POA: Diagnosis not present

## 2023-06-10 DIAGNOSIS — D225 Melanocytic nevi of trunk: Secondary | ICD-10-CM | POA: Diagnosis not present

## 2023-06-27 IMAGING — MG MM DIGITAL DIAGNOSTIC UNILAT*R* W/ TOMO W/ CAD
4 series · 4 of 12 positions shown · non-contrast
Comparison: Previous exam(s).

CLINICAL DATA: 46-year-old female recalled from screening mammogram
dated 07/29/2021 for a possible right breast asymmetry.

EXAM:
DIGITAL DIAGNOSTIC UNILATERAL RIGHT MAMMOGRAM WITH TOMOSYNTHESIS AND
CAD; ULTRASOUND RIGHT BREAST LIMITED
TECHNIQUE: Right digital diagnostic mammography and breast tomosynthesis was
performed. The images were evaluated with computer-aided detection.;
Targeted ultrasound examination of the right breast was performed

[R MLO synth-2D]
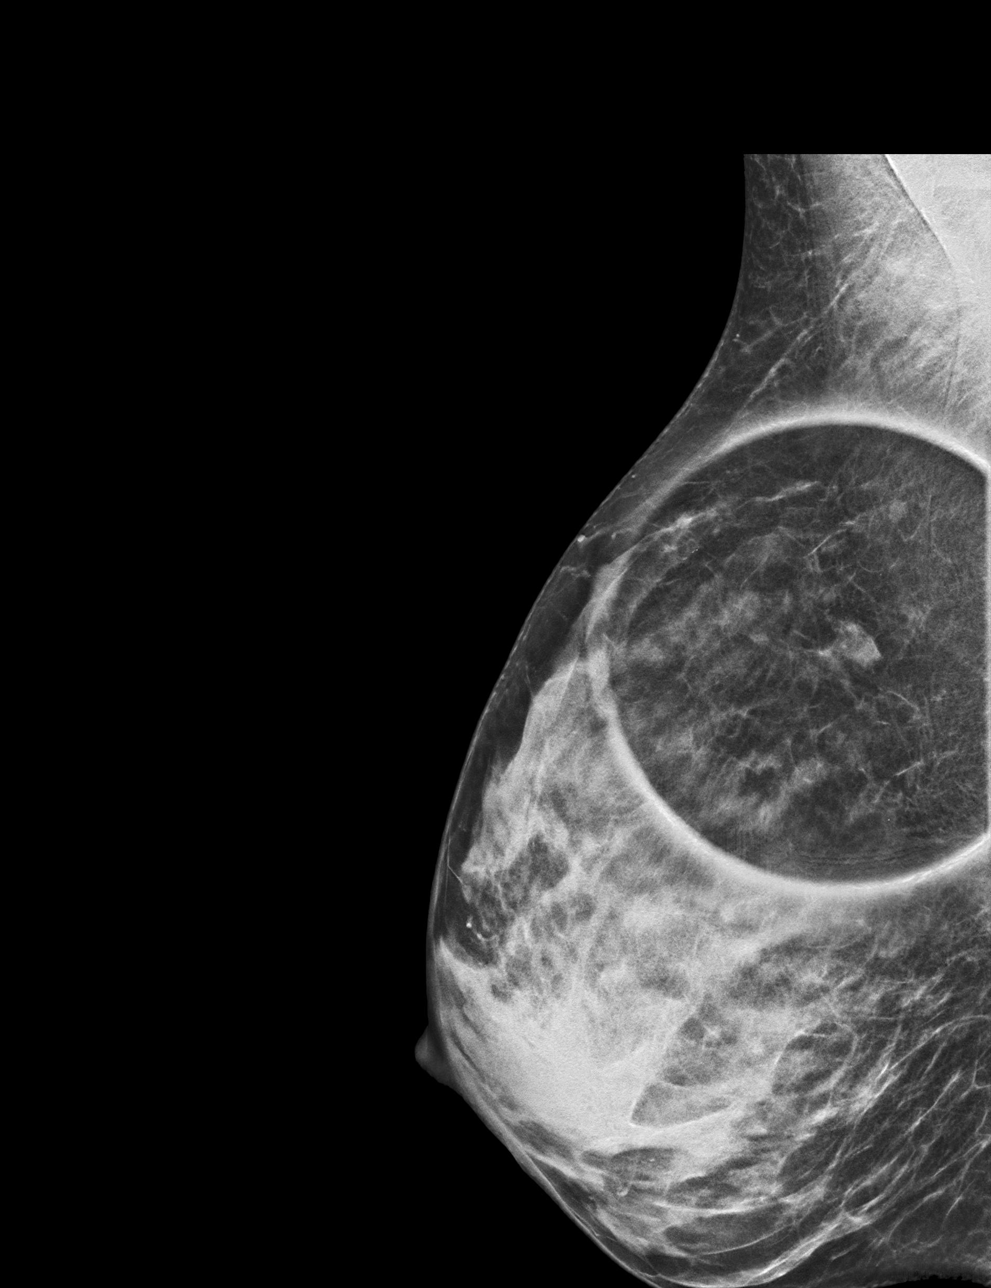

[R XCCL synth-2D]
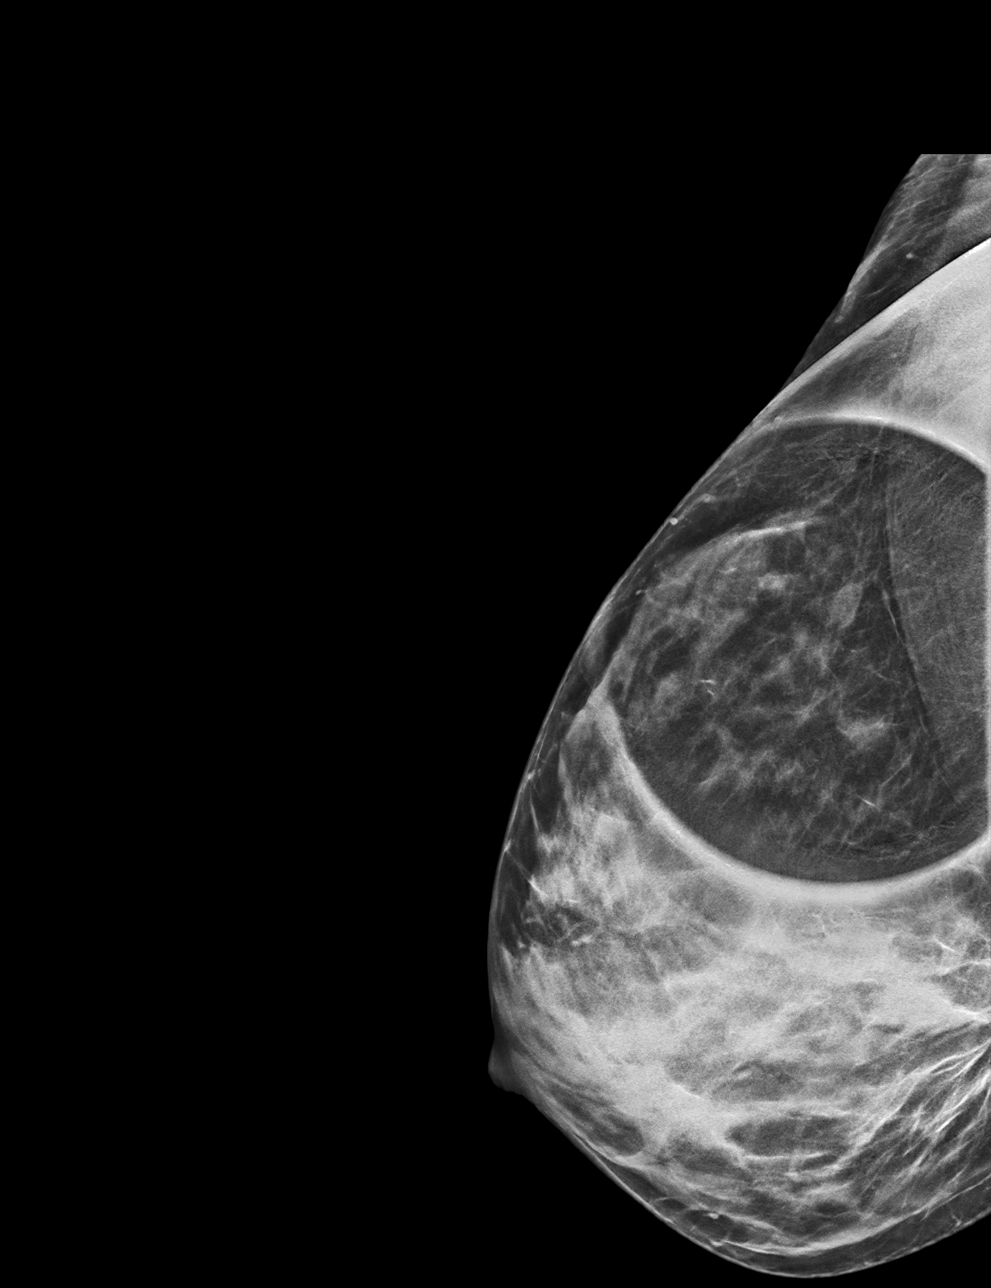

[R MLO tomo · tomo slice 20/39.0]
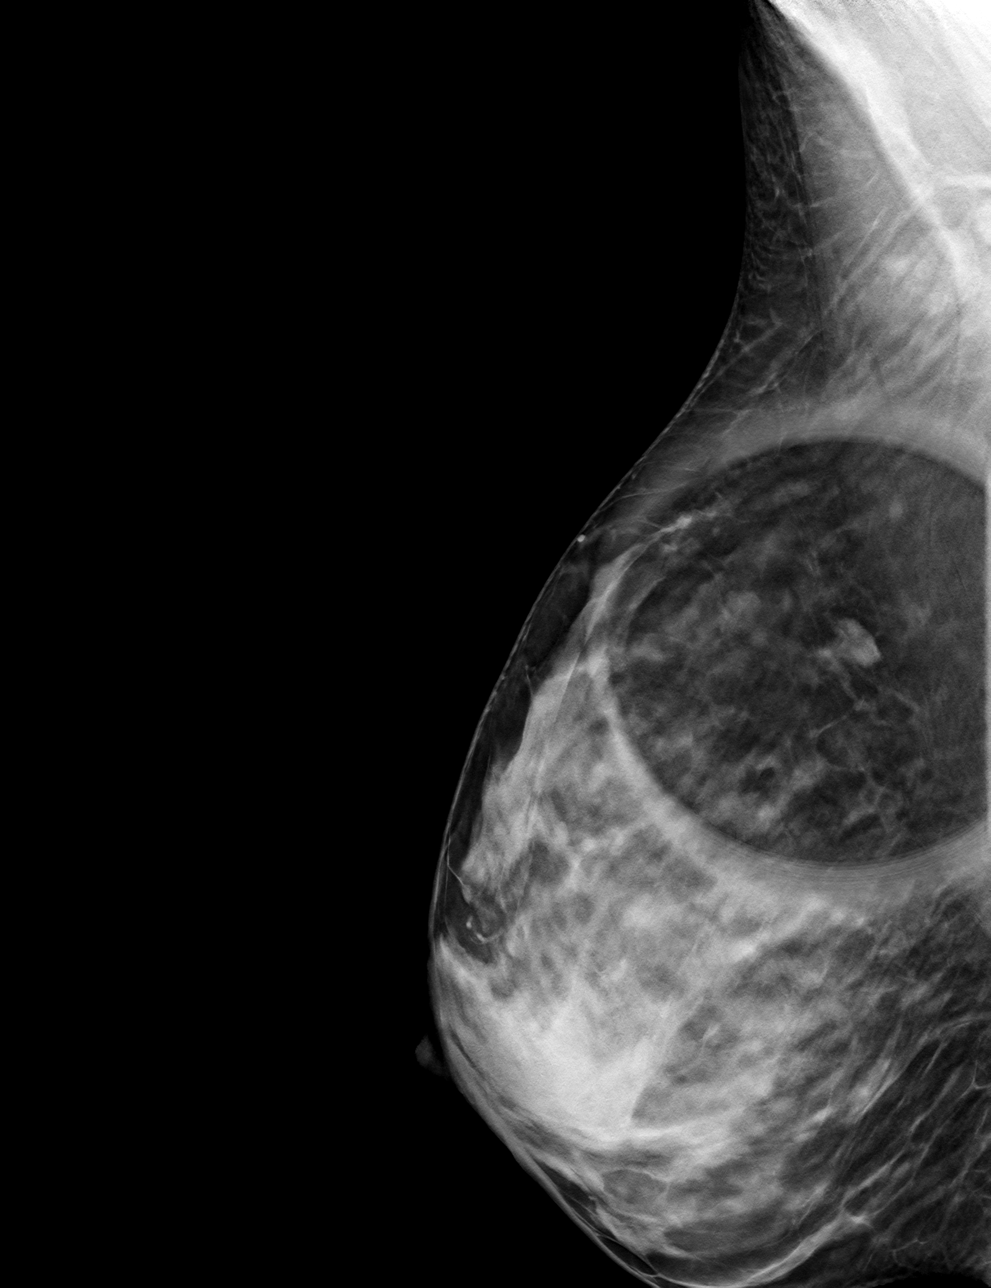

[R XCCL tomo · tomo slice 21/42.0]
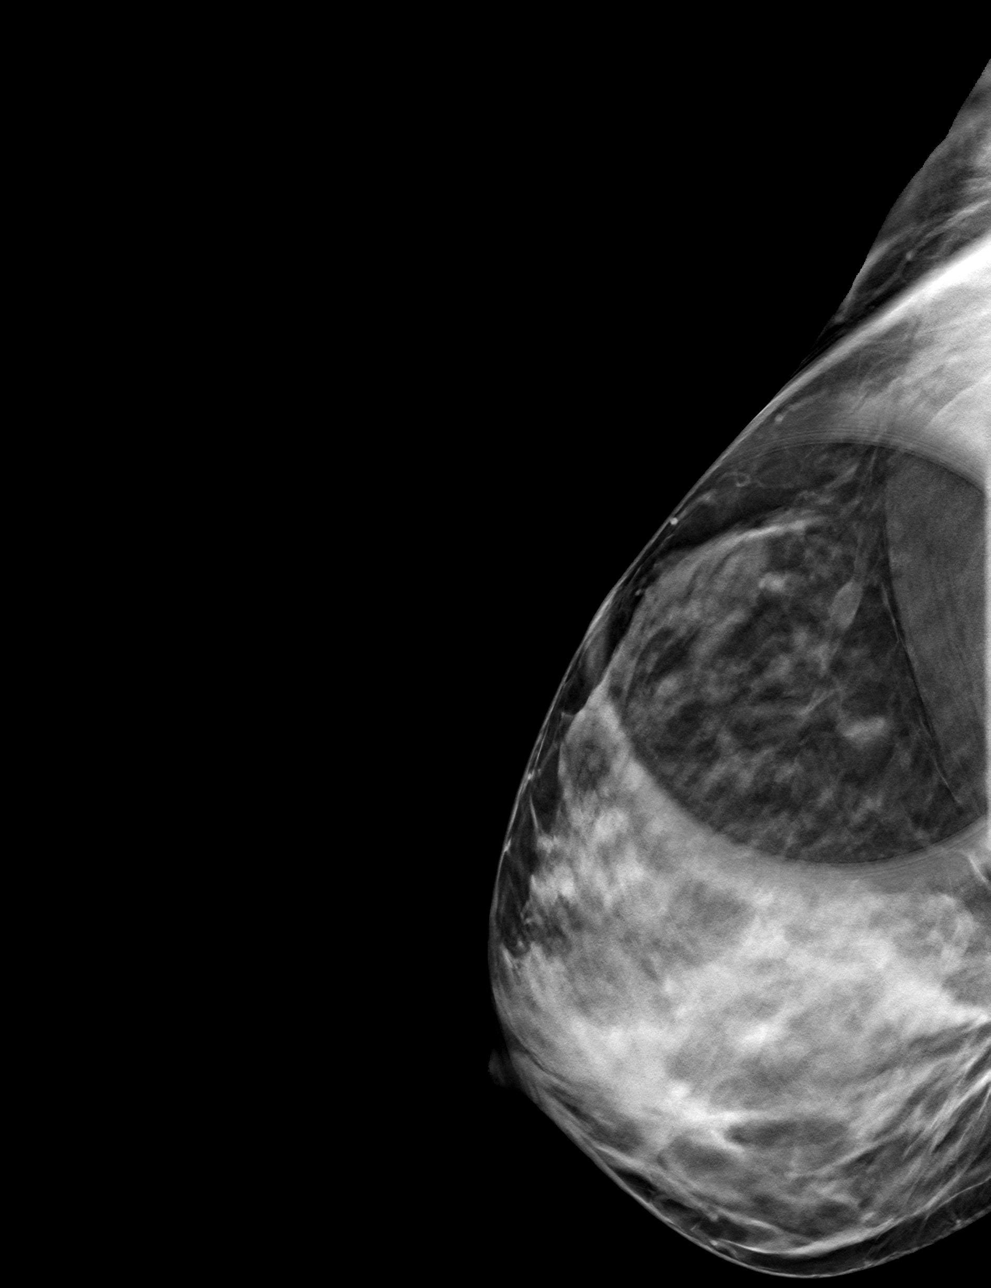

[4 of 12 positions shown; findings below may reference images not displayed]

ACR Breast Density Category c: The breast tissue is heterogeneously
dense, which may obscure small masses.
FINDINGS: There is a persistent, developing asymmetry in the upper right
breast at posterior depth is seen on the MLO projection. Possible
correlate is identified centrally on the exaggerated CC projection.
Further evaluation with ultrasound was performed.

Targeted ultrasound is performed, showing an irregular hypoechoic
mass with hyperechoic rim at the 12 o'clock position 7 cm from the
nipple. It measures 1.1 x 0.4 x 1.0 cm. There is no definite
internal vascularity. This likely corresponds with the mammographic
finding. Evaluation of the right axilla demonstrates no suspicious
lymphadenopathy.
IMPRESSION: 1. Indeterminate right breast mass, likely corresponding with a
mammographic asymmetry.
2. No suspicious right axillary lymphadenopathy.

RECOMMENDATION:
1. Ultrasound-guided biopsy along the 12 o'clock position of the
right breast is recommended.
2. Recommend close attention on post clip films to ensure
mammographic correlation. If this does not correspond with the
mammographic asymmetry, additional stereotactic biopsy is
recommended.

I have discussed the findings and recommendations with the patient.
If applicable, a reminder letter will be sent to the patient
regarding the next appointment.

BI-RADS CATEGORY  4: Suspicious.

## 2023-06-27 IMAGING — US US BREAST*R* LIMITED INC AXILLA
1 series · 12 of 12 positions shown · non-contrast
Comparison: Previous exam(s).

CLINICAL DATA: 46-year-old female recalled from screening mammogram
dated 07/29/2021 for a possible right breast asymmetry.

EXAM:
DIGITAL DIAGNOSTIC UNILATERAL RIGHT MAMMOGRAM WITH TOMOSYNTHESIS AND
CAD; ULTRASOUND RIGHT BREAST LIMITED
TECHNIQUE: Right digital diagnostic mammography and breast tomosynthesis was
performed. The images were evaluated with computer-aided detection.;
Targeted ultrasound examination of the right breast was performed

[Series 1: us breast*right* limited inc axilla · 0.06mm/px · 12 acquisitions, 12 frames shown]
[im 1/12]
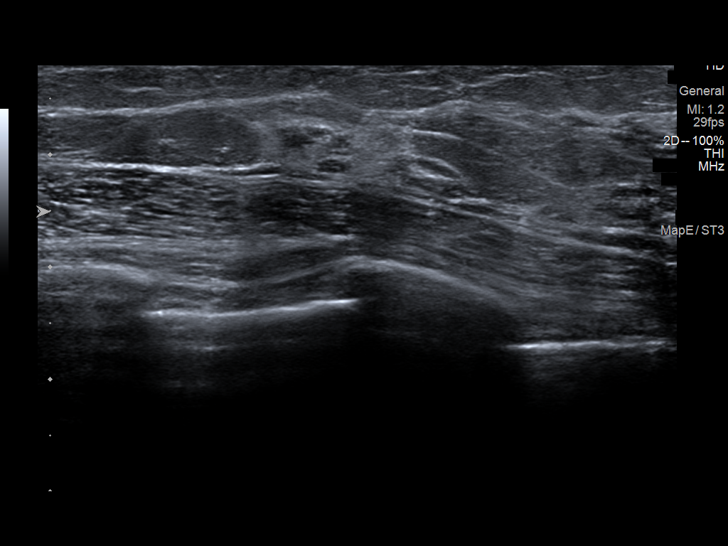
[im 2/12]
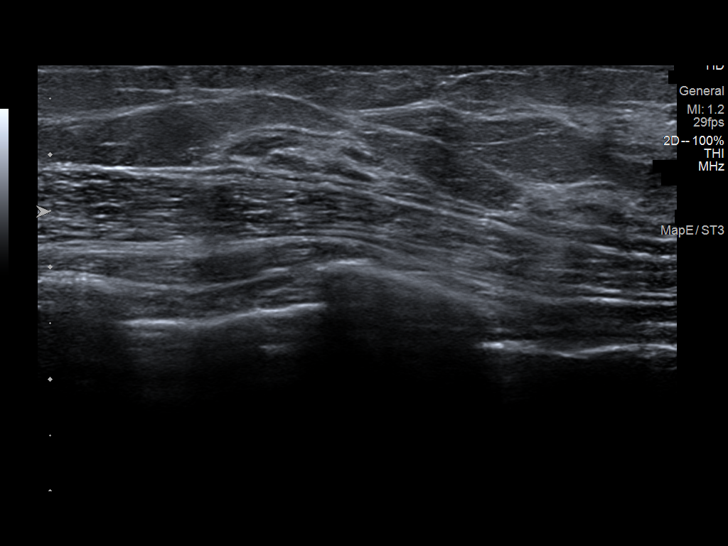
[im 3/12]
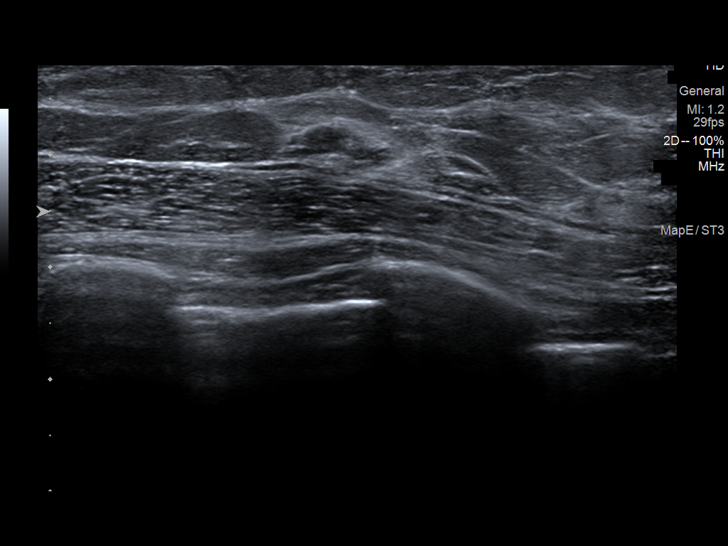
[im 4/12]
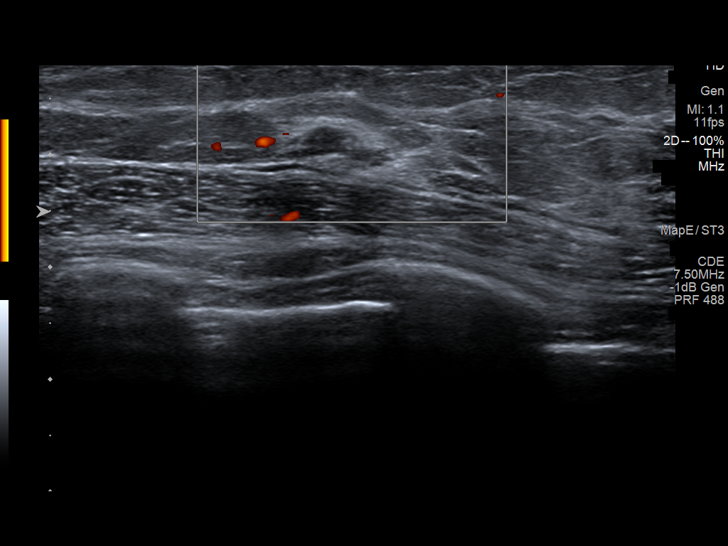
[im 5/12]
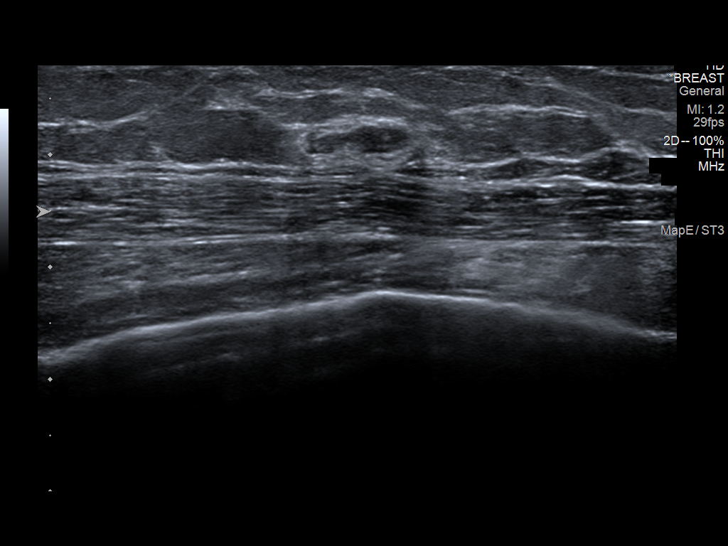
[im 6/12]
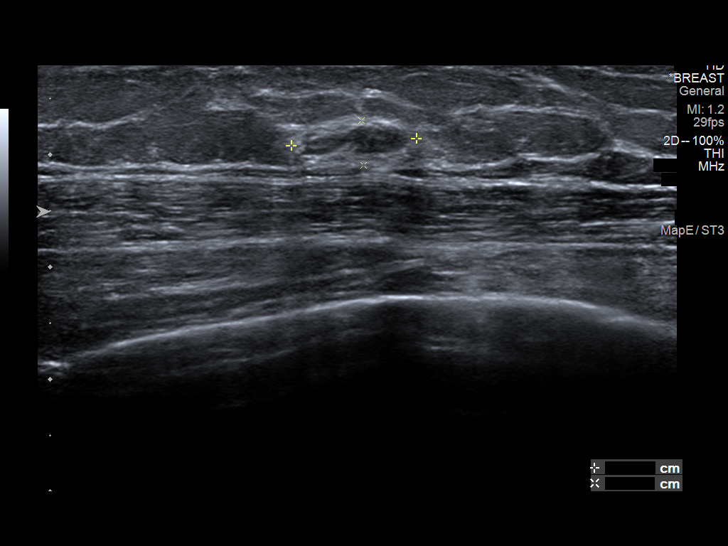
[im 7/12]
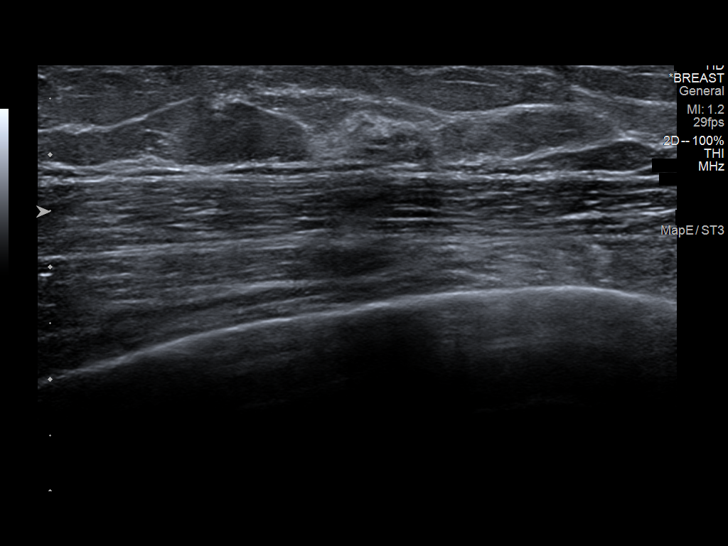
[im 8/12]
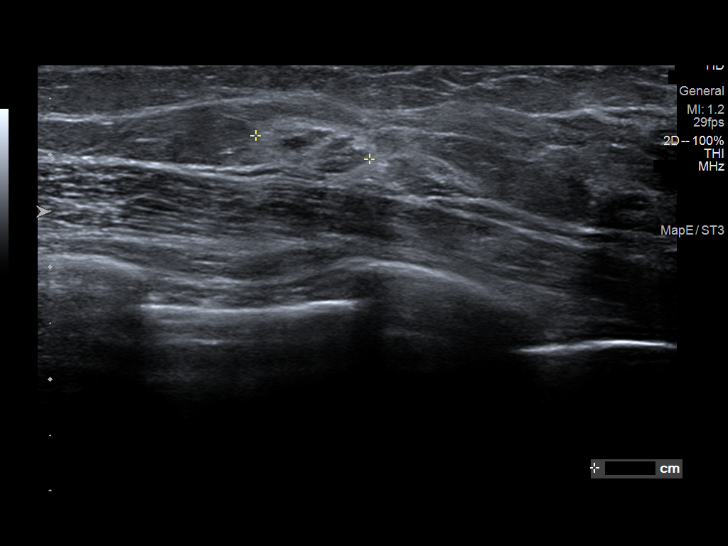
[im 9/12]
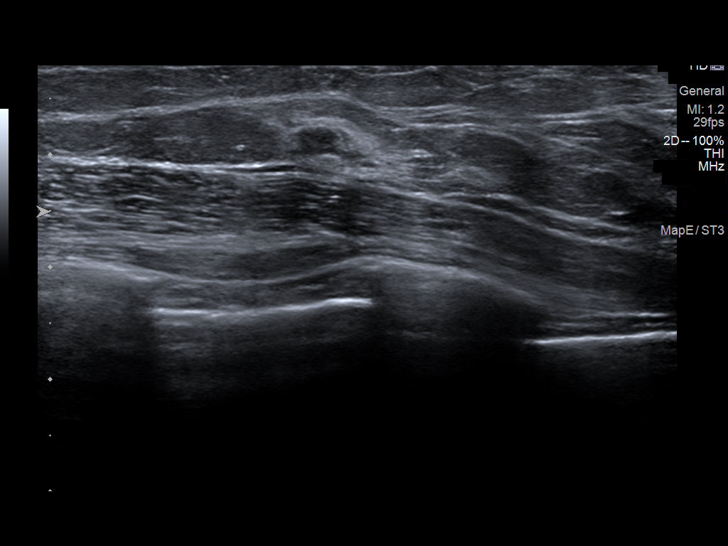
[im 10/12]
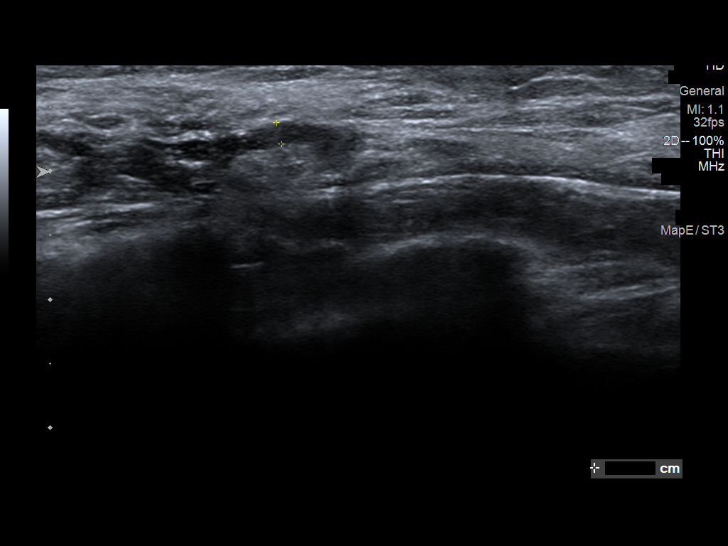
[im 11/12]
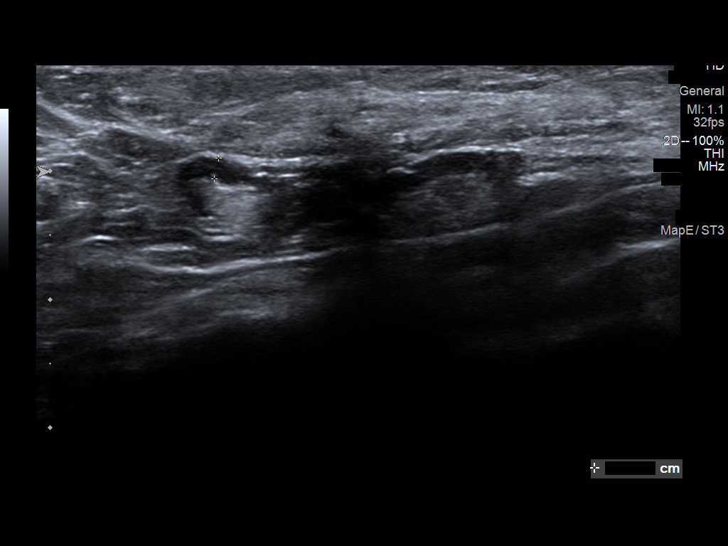
[im 12/12]
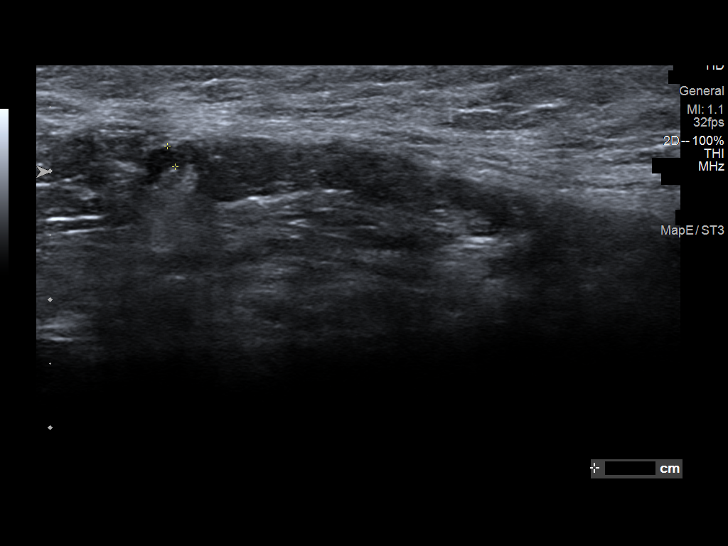

[12 of 12 positions shown; findings below may reference images not displayed]

ACR Breast Density Category c: The breast tissue is heterogeneously
dense, which may obscure small masses.
FINDINGS: There is a persistent, developing asymmetry in the upper right
breast at posterior depth is seen on the MLO projection. Possible
correlate is identified centrally on the exaggerated CC projection.
Further evaluation with ultrasound was performed.

Targeted ultrasound is performed, showing an irregular hypoechoic
mass with hyperechoic rim at the 12 o'clock position 7 cm from the
nipple. It measures 1.1 x 0.4 x 1.0 cm. There is no definite
internal vascularity. This likely corresponds with the mammographic
finding. Evaluation of the right axilla demonstrates no suspicious
lymphadenopathy.
IMPRESSION: 1. Indeterminate right breast mass, likely corresponding with a
mammographic asymmetry.
2. No suspicious right axillary lymphadenopathy.

RECOMMENDATION:
1. Ultrasound-guided biopsy along the 12 o'clock position of the
right breast is recommended.
2. Recommend close attention on post clip films to ensure
mammographic correlation. If this does not correspond with the
mammographic asymmetry, additional stereotactic biopsy is
recommended.

I have discussed the findings and recommendations with the patient.
If applicable, a reminder letter will be sent to the patient
regarding the next appointment.

BI-RADS CATEGORY  4: Suspicious.

## 2023-08-12 DIAGNOSIS — R0789 Other chest pain: Secondary | ICD-10-CM | POA: Diagnosis not present

## 2023-09-02 DIAGNOSIS — D225 Melanocytic nevi of trunk: Secondary | ICD-10-CM | POA: Diagnosis not present

## 2023-09-02 DIAGNOSIS — Z1283 Encounter for screening for malignant neoplasm of skin: Secondary | ICD-10-CM | POA: Diagnosis not present

## 2023-09-09 DIAGNOSIS — D122 Benign neoplasm of ascending colon: Secondary | ICD-10-CM | POA: Diagnosis not present

## 2023-09-09 DIAGNOSIS — Z1211 Encounter for screening for malignant neoplasm of colon: Secondary | ICD-10-CM | POA: Diagnosis not present

## 2023-10-14 DIAGNOSIS — Z1231 Encounter for screening mammogram for malignant neoplasm of breast: Secondary | ICD-10-CM | POA: Diagnosis not present

## 2023-10-14 DIAGNOSIS — R92333 Mammographic heterogeneous density, bilateral breasts: Secondary | ICD-10-CM | POA: Diagnosis not present

## 2023-11-18 DIAGNOSIS — R0781 Pleurodynia: Secondary | ICD-10-CM | POA: Diagnosis not present

## 2023-11-18 DIAGNOSIS — Z6825 Body mass index (BMI) 25.0-25.9, adult: Secondary | ICD-10-CM | POA: Diagnosis not present

## 2023-12-30 DIAGNOSIS — M546 Pain in thoracic spine: Secondary | ICD-10-CM | POA: Diagnosis not present

## 2024-03-19 DIAGNOSIS — Z09 Encounter for follow-up examination after completed treatment for conditions other than malignant neoplasm: Secondary | ICD-10-CM | POA: Diagnosis not present

## 2024-03-19 DIAGNOSIS — Z860101 Personal history of adenomatous and serrated colon polyps: Secondary | ICD-10-CM | POA: Diagnosis not present

## 2024-03-19 DIAGNOSIS — K5730 Diverticulosis of large intestine without perforation or abscess without bleeding: Secondary | ICD-10-CM | POA: Diagnosis not present

## 2024-03-19 DIAGNOSIS — K648 Other hemorrhoids: Secondary | ICD-10-CM | POA: Diagnosis not present

## 2024-03-30 DIAGNOSIS — S6991XA Unspecified injury of right wrist, hand and finger(s), initial encounter: Secondary | ICD-10-CM | POA: Diagnosis not present

## 2024-03-30 DIAGNOSIS — S62636A Displaced fracture of distal phalanx of right little finger, initial encounter for closed fracture: Secondary | ICD-10-CM | POA: Diagnosis not present

## 2024-03-30 DIAGNOSIS — S6010XA Contusion of unspecified finger with damage to nail, initial encounter: Secondary | ICD-10-CM | POA: Diagnosis not present

## 2024-04-13 DIAGNOSIS — M2540 Effusion, unspecified joint: Secondary | ICD-10-CM | POA: Diagnosis not present

## 2024-04-13 DIAGNOSIS — Z131 Encounter for screening for diabetes mellitus: Secondary | ICD-10-CM | POA: Diagnosis not present

## 2024-04-13 DIAGNOSIS — Z0000 Encounter for general adult medical examination without abnormal findings: Secondary | ICD-10-CM | POA: Diagnosis not present

## 2024-04-13 DIAGNOSIS — Z13220 Encounter for screening for lipoid disorders: Secondary | ICD-10-CM | POA: Diagnosis not present

## 2024-04-13 DIAGNOSIS — Z23 Encounter for immunization: Secondary | ICD-10-CM | POA: Diagnosis not present

## 2024-10-05 ENCOUNTER — Ambulatory Visit: Payer: Self-pay
